# Patient Record
Sex: Female | Born: 1985 | Race: White | Hispanic: No | Marital: Married | Smoking: Never smoker
Health system: Southern US, Community
[De-identification: ages and names within clinical notes are randomized; demographics above are authoritative.]

## PROBLEM LIST (undated history)

## (undated) DIAGNOSIS — E119 Type 2 diabetes mellitus without complications: Secondary | ICD-10-CM

## (undated) HISTORY — DX: Type 2 diabetes mellitus without complications: E11.9

---
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
## 2013-11-03 DIAGNOSIS — IMO0002 Reserved for concepts with insufficient information to code with codable children: Secondary | ICD-10-CM | POA: Insufficient documentation

## 2013-11-03 DIAGNOSIS — E1065 Type 1 diabetes mellitus with hyperglycemia: Secondary | ICD-10-CM | POA: Insufficient documentation

## 2014-07-17 ENCOUNTER — Ambulatory Visit (INDEPENDENT_AMBULATORY_CARE_PROVIDER_SITE_OTHER): Payer: BLUE CROSS/BLUE SHIELD | Admitting: Sports Medicine

## 2014-07-17 ENCOUNTER — Encounter: Payer: Self-pay | Admitting: Sports Medicine

## 2014-07-17 VITALS — BP 134/88 | Ht 68.5 in | Wt 210.0 lb

## 2014-07-17 DIAGNOSIS — R269 Unspecified abnormalities of gait and mobility: Secondary | ICD-10-CM | POA: Diagnosis not present

## 2014-07-17 DIAGNOSIS — M722 Plantar fascial fibromatosis: Secondary | ICD-10-CM

## 2014-07-20 DIAGNOSIS — R269 Unspecified abnormalities of gait and mobility: Secondary | ICD-10-CM | POA: Insufficient documentation

## 2014-07-20 DIAGNOSIS — M722 Plantar fascial fibromatosis: Secondary | ICD-10-CM | POA: Insufficient documentation

## 2014-07-20 NOTE — Progress Notes (Signed)
  Rebecca Chavez - 29 y.o. female MRN [id number]  Date of birth: 1985-07-30  SUBJECTIVE: CC: Left heel pain, new patient initial evaluation, referred by Rebecca HerterBecky Chavez HPI: Insidious onset of 8 months left plantar heel pain  Significantly increased activity last year including 4 half marathons.  Pain has become progressively worse  Has been seen by Rebecca Chavez and undergone plantar fascial injection  Some gentle Stretching exercises no specific eccentric's  Has tried different shoes but no cushioned arch support  ROS: Denies any numbness, tingling, lower extremity swelling.  HISTORY:  Past Medical, Surgical, Social, and Family History reviewed & updated per EMR.  Pertinent Historical Findings include:  reports that she has never smoked. She does not have any smokeless tobacco history on file. History of type 1 diabetes, historically well controlled last A1c greater than 8 Otherwise relatively healthy   OBJECTIVE:  VS:   HT:5' 8.5" (174 cm)   WT:210 lb (95.255 kg)  BMI:31.5          BP:134/88 mmHg  HR: bpm  TEMP: ( )  RESP:   PHYSICAL EXAM:  GENERAL: Adult Caucasian female. No acute distress PSYCH: Alert and appropriately interactive. VASCULAR: DP and PT pulses 2+/4. No pretibial edema. NEURO: Sensation is intact to light touch in bilateral lower extremities BILATERAL FEET: Splaying between first and second bilateral, worsening second and third splaying on left. Incurling of fourth and fifth bilaterally. Bilateral dorsiflexion to 95 to 100. Normal inversion, eversion. Neutral calcaneus at rest but poor posterior tibialis recruitment with toe off. GAIT: Running gait with significant hyperpronation bilaterally initially left worse than right, with orthotic correction left essentially neutral, right with slight dynamic shift.  DATA OBTAINED DURING VISIT: LIMITED MSK ULTRASOUND OF BILATERAL FEET: Findings:   Left plantar fascia 0.76 cm with hypoechoic change at the insertion,  and normal long arch at the mid foot Right plantar fascia 0.45 cm with minimal hypoechoic change, normal long arch at the mid foot Impression: Left plantar fasciitis  ASSESSMENT: 1. Plantar fasciitis of left foot   2. Abnormality of gait     Problem  Plantar Fasciitis of Left Foot  Abnormality of Gait   Significant pronation of bilateral feet, left fully corrected with custom orthotics     PROCEDURE NOTE:  CUSTOM ORTHOTICS The patient was fitted for a standard, cushioned, semi-rigid orthotic. The orthotic was heated & placed on the orthotic stand. The patient was positioned in subtalar neutral position and 10 of ankle dorsiflexion and weight bearing stance some heated orthotic blank. After completion of the molding a stable paste was applied to the orthotic blank. The orthotic was ground to a stable position for weightbearing. The patient ambulated in these and reported they were comfortable without pressure spots.              BLANK:  Size 8 - Standard Cushioned                 BASE:  Blue EVA      POSTINGS:  Small metatarsal pads  >50% of this 45  minute visit was spent in direct face to face evaluation, measurement and manufacture of custom molded orthotic.  PLAN: See problem based charting & AVS for additional documentation.  Custom orthotics today,  Alfredson's exercises with additional pigeon toed walking and pigeon toed Alfredson's  Arch strap provided  ice when necessary  > Return in about 6 weeks (around 08/28/2014) for repeat ultrasound.

## 2014-09-02 ENCOUNTER — Ambulatory Visit: Payer: BLUE CROSS/BLUE SHIELD | Admitting: Sports Medicine

## 2015-02-23 ENCOUNTER — Ambulatory Visit (INDEPENDENT_AMBULATORY_CARE_PROVIDER_SITE_OTHER): Payer: BC Managed Care – PPO | Admitting: Emergency Medicine

## 2015-02-23 ENCOUNTER — Ambulatory Visit (INDEPENDENT_AMBULATORY_CARE_PROVIDER_SITE_OTHER): Payer: BC Managed Care – PPO

## 2015-02-23 VITALS — BP 132/80 | HR 84 | Temp 98.6°F | Resp 18 | Ht 70.0 in | Wt 224.0 lb

## 2015-02-23 DIAGNOSIS — S9032XA Contusion of left foot, initial encounter: Secondary | ICD-10-CM

## 2015-02-23 MED ORDER — HYDROCODONE-ACETAMINOPHEN 5-325 MG PO TABS: 1.0000 | ORAL_TABLET | ORAL | Status: AC | PRN

## 2015-02-23 NOTE — Progress Notes (Signed)
Subjective:  Patient ID: Rebecca Chavez, female    DOB: 09/26/1985  Age: 29 y.o. MRN: [id number]  CC: Foot Injury   HPI Rebecca Chavez presents  with an injury to her left foot suffered yesterday when she dropped a portable drill tap recharger on her foot. She has ecchymosis and swelling in the metatarsal phalangeal joints. There is no deformity. She has pain with ambulation and standing. She had no improvement with over-the-counter medication  History Rebecca Chavez has a past medical history of Diabetes mellitus without complication.   She has no past surgical history on file.   Her  family history includes Hyperlipidemia in her father.  She   reports that she has never smoked. She does not have any smokeless tobacco history on file. Her alcohol and drug histories are not on file.  Outpatient Prescriptions Prior to Visit  Medication Sig Dispense Refill  . JUNEL FE 1/20 1-20 MG-MCG tablet Take 1 tablet by mouth daily.  4  . NOVOLOG 100 UNIT/ML injection   5   No facility-administered medications prior to visit.    Social History   Social History  . Marital Status: Married    Spouse Name: N/A  . Number of Children: N/A  . Years of Education: N/A   Social History Main Topics  . Smoking status: Never Smoker   . Smokeless tobacco: None  . Alcohol Use: None  . Drug Use: None  . Sexual Activity: Not Asked   Other Topics Concern  . None   Social History Narrative     Review of Systems  Constitutional: Negative for fever, chills and appetite change.  HENT: Negative for congestion, ear pain, postnasal drip, sinus pressure and sore throat.   Eyes: Negative for pain and redness.  Respiratory: Negative for cough, shortness of breath and wheezing.   Cardiovascular: Negative for leg swelling.  Gastrointestinal: Negative for nausea, vomiting, abdominal pain, diarrhea, constipation and blood in stool.  Endocrine: Negative for polyuria.  Genitourinary: Negative for dysuria,  urgency, frequency and flank pain.  Musculoskeletal: Negative for gait problem.  Skin: Negative for rash.  Neurological: Negative for weakness and headaches.  Psychiatric/Behavioral: Negative for confusion and decreased concentration. The patient is not nervous/anxious.     Objective:  BP 132/80 mmHg  Pulse 84  Temp(Src) 98.6 F (37 C) (Oral)  Resp 18  Ht  (1.778 m)  Wt 224 lb (101.606 kg)  BMI 32.14 kg/m2  SpO2 99%  LMP 02/22/2015 (Exact Date)  Physical Exam  Constitutional: She is oriented to person, place, and time. She appears well-developed and well-nourished. No distress.  HENT:  Head: Normocephalic and atraumatic.  Right Ear: External ear normal.  Left Ear: External ear normal.  Nose: Nose normal.  Eyes: Conjunctivae and EOM are normal. Pupils are equal, round, and reactive to light. No scleral icterus.  Neck: Normal range of motion. Neck supple. No tracheal deviation present.  Cardiovascular: Normal rate, regular rhythm and normal heart sounds.   Pulmonary/Chest: Effort normal. No respiratory distress. She has no wheezes. She has no rales.  Abdominal: She exhibits no mass. There is no tenderness. There is no rebound and no guarding.  Musculoskeletal: She exhibits no edema.       Left foot: There is decreased range of motion, tenderness and swelling.  Lymphadenopathy:    She has no cervical adenopathy.  Neurological: She is alert and oriented to person, place, and time. Coordination normal.  Skin: Skin is warm and dry. No rash  noted.  Psychiatric: She has a normal mood and affect. Her behavior is normal.    She has a contusion on of the forefoot metatarsal phalangeal joint of the second third and fourth  toes  Assessment & Plan:   Rebecca Chavez was seen today for foot injury.  Diagnoses and all orders for this visit:  Contusion, foot, left, initial encounter -     DG Foot Complete Left; Future  Other orders -     HYDROcodone-acetaminophen (NORCO) 5-325 MG  tablet; Take 1-2 tablets by mouth every 4 (four) hours as needed.   I am having Ms. Preslar start on HYDROcodone-acetaminophen. I am also having her maintain her NOVOLOG and JUNEL FE 1/20.  Meds ordered this encounter  Medications  . HYDROcodone-acetaminophen (NORCO) 5-325 MG tablet    Sig: Take 1-2 tablets by mouth every 4 (four) hours as needed.    Dispense:  30 tablet    Refill:  0    Appropriate red flag conditions were discussed with the patient as well as actions that should be taken.  Patient expressed his understanding.  Follow-up: Return if symptoms worsen or fail to improve.  Carmelina Dane, MD   [facility] reading (PRIMARY) by  Dr. Dareen Piano.   Negative foot.

## 2015-02-23 NOTE — Patient Instructions (Signed)

## 2016-05-22 IMAGING — CR DG FOOT COMPLETE 3+V*L*
3 series · 3 of 3 positions shown · non-contrast
Comparison: None.

CLINICAL DATA: Foot pain

EXAM:
LEFT FOOT - COMPLETE 3+ VIEW

[AP]
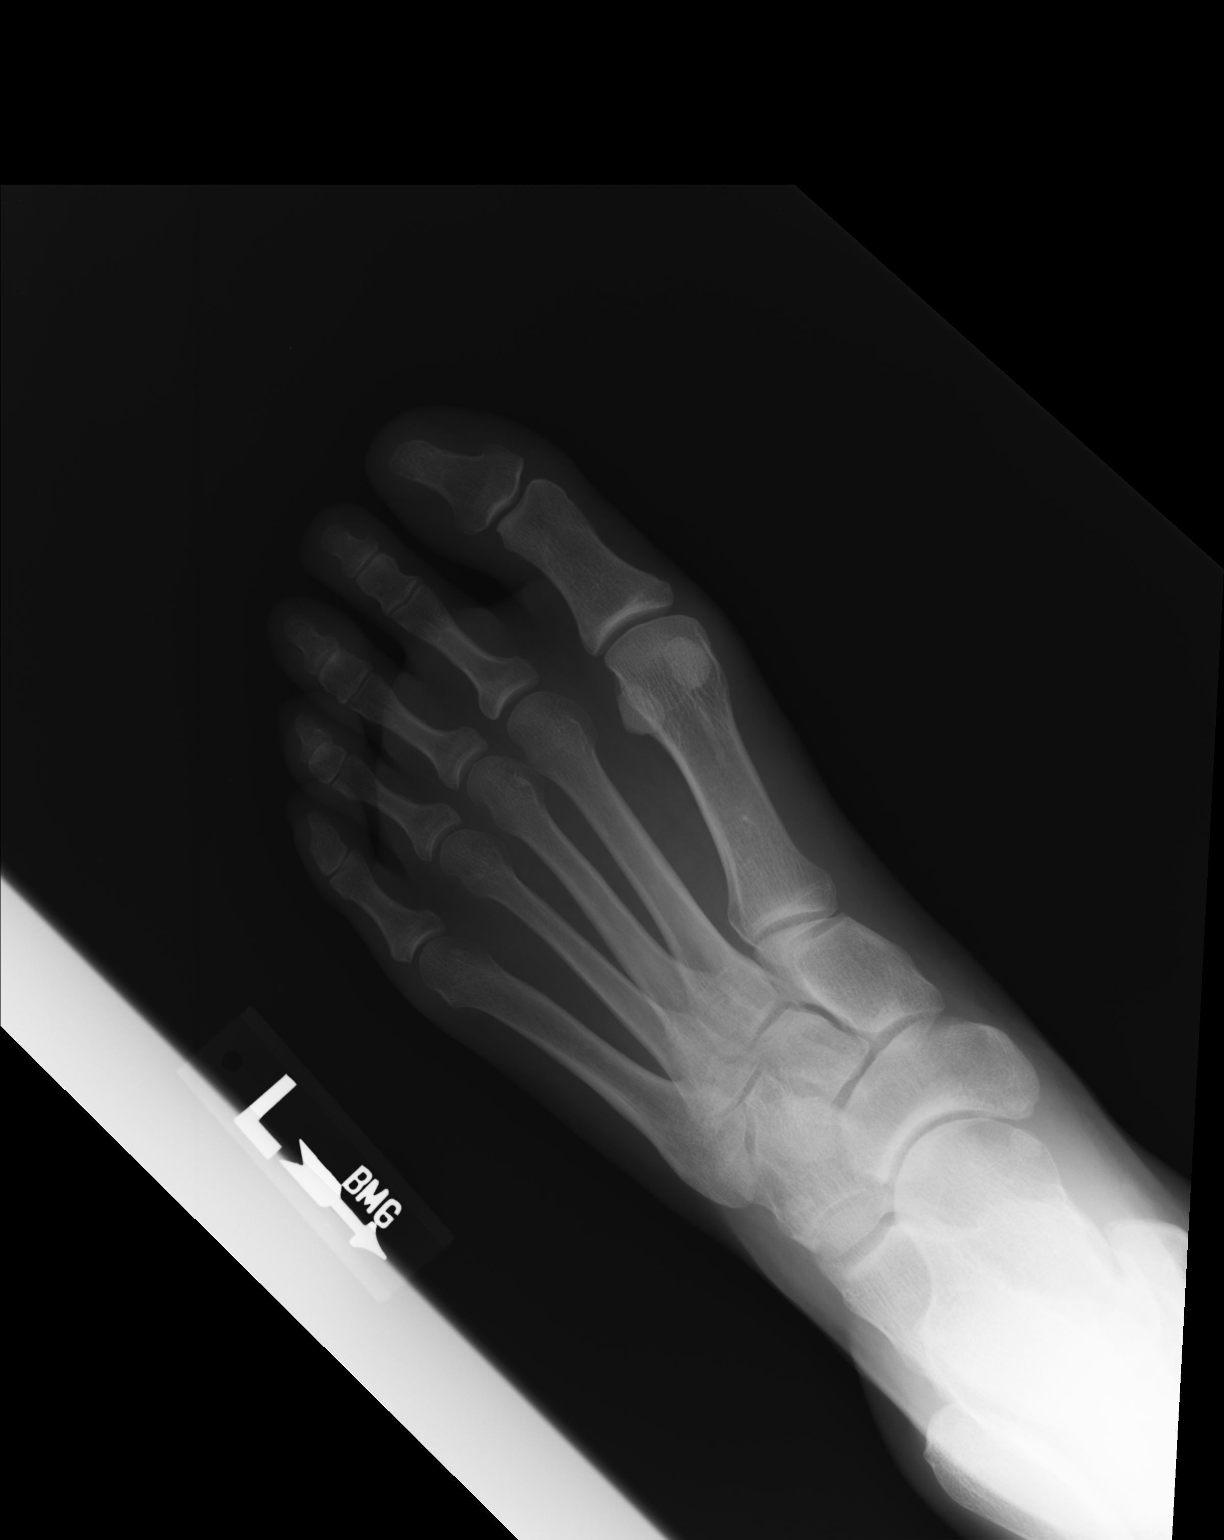

[ap obl int rot]
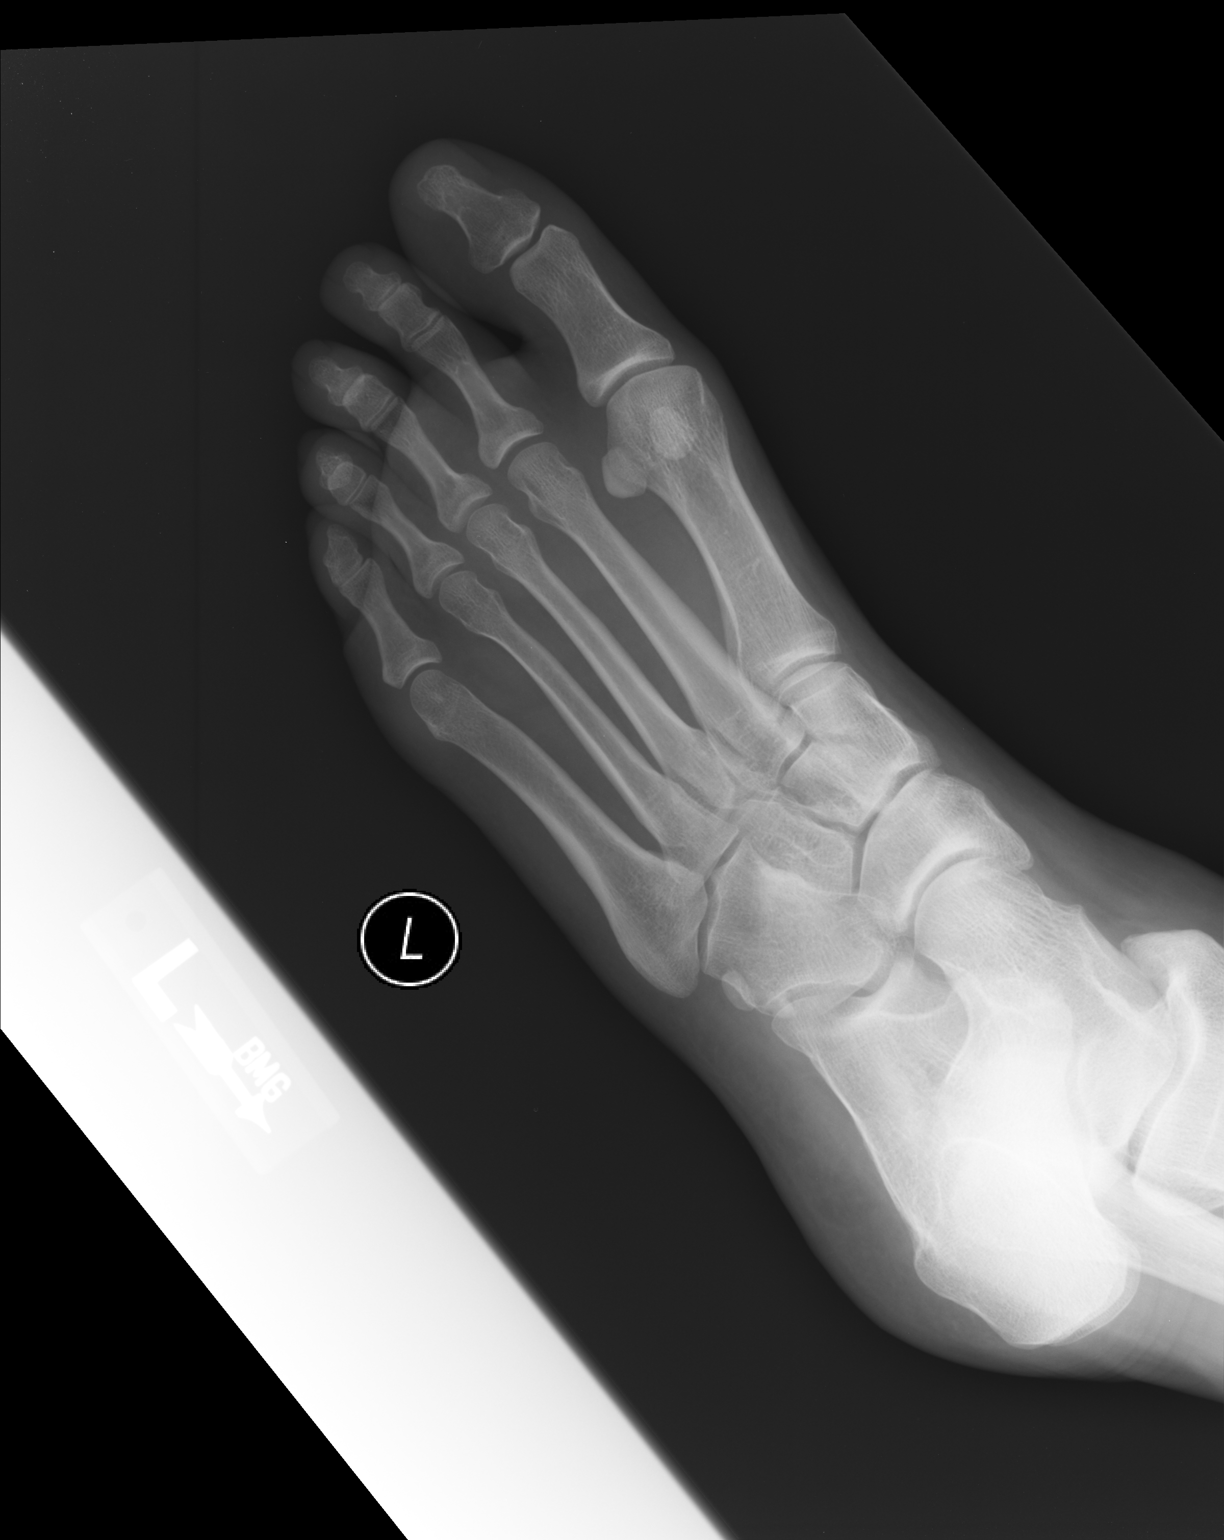

[lateral]
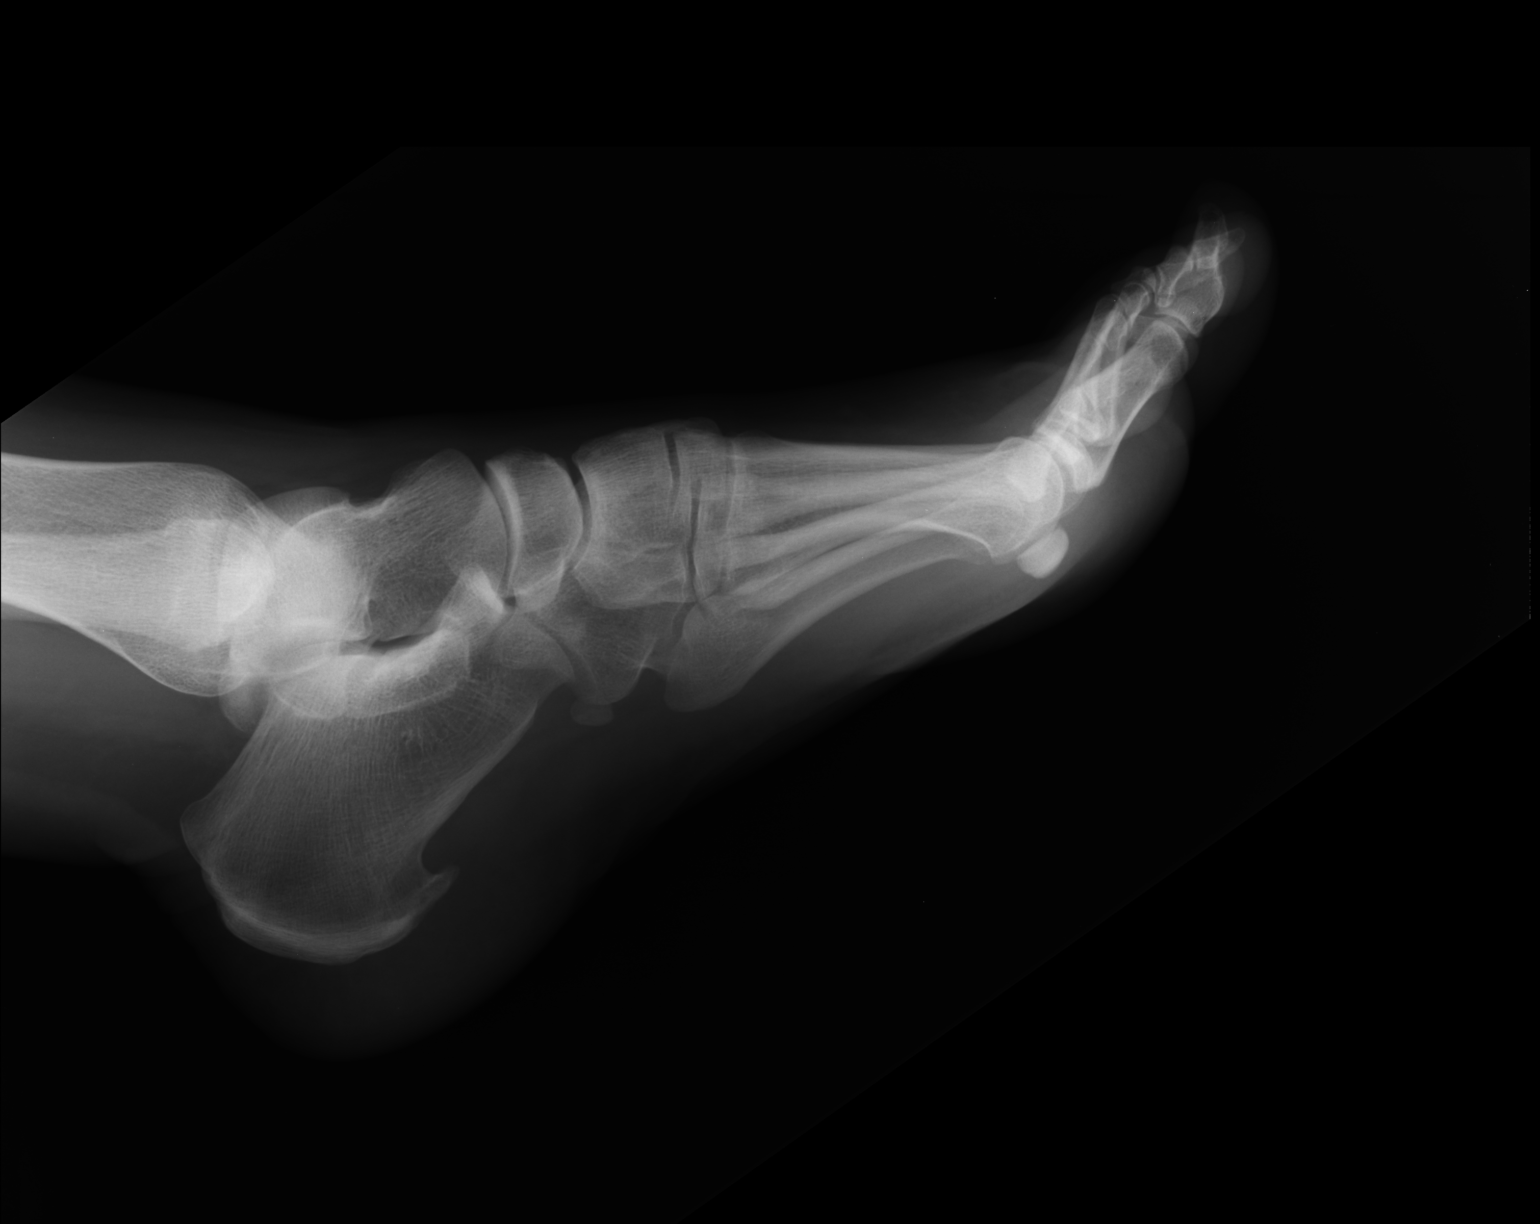

[3 of 3 positions shown; findings below may reference images not displayed]

FINDINGS: There is no evidence of fracture or dislocation. There is plantar
calcaneal spur. Soft tissues are unremarkable.
IMPRESSION: No acute fracture or dislocation.
